# Patient Record
Sex: Female | Born: 1950 | Race: Black or African American | Hispanic: No | Marital: Married | Smoking: Former smoker
Health system: Southern US, Community
[De-identification: ages and names within clinical notes are randomized; demographics above are authoritative.]

## PROBLEM LIST (undated history)

## (undated) DIAGNOSIS — J45909 Unspecified asthma, uncomplicated: Secondary | ICD-10-CM

## (undated) DIAGNOSIS — I10 Essential (primary) hypertension: Secondary | ICD-10-CM

## (undated) DIAGNOSIS — I2541 Coronary artery aneurysm: Secondary | ICD-10-CM

## (undated) DIAGNOSIS — R42 Dizziness and giddiness: Secondary | ICD-10-CM

## (undated) DIAGNOSIS — Z201 Contact with and (suspected) exposure to tuberculosis: Secondary | ICD-10-CM

## (undated) DIAGNOSIS — G4730 Sleep apnea, unspecified: Secondary | ICD-10-CM

## (undated) HISTORY — PX: KNEE SURGERY: SHX244

## (undated) HISTORY — PX: ANKLE FUSION: SHX881

## (undated) HISTORY — PX: APPENDECTOMY: SHX54

## (undated) HISTORY — PX: ABDOMINAL HYSTERECTOMY: SHX81

## (undated) HISTORY — PX: TUBAL LIGATION: SHX77

## (undated) HISTORY — PX: BREAST LUMPECTOMY: SHX2

---

## 2010-03-07 ENCOUNTER — Emergency Department (HOSPITAL_COMMUNITY): Admission: EM | Admit: 2010-03-07 | Discharge: 2010-03-07 | Payer: Self-pay | Admitting: Family Medicine

## 2010-03-17 ENCOUNTER — Emergency Department (HOSPITAL_COMMUNITY): Admission: EM | Admit: 2010-03-17 | Discharge: 2010-03-17 | Payer: Self-pay | Admitting: Emergency Medicine

## 2011-06-22 HISTORY — PX: HERNIA REPAIR: SHX51

## 2015-06-25 ENCOUNTER — Emergency Department (HOSPITAL_COMMUNITY): Payer: Medicare Other

## 2015-06-25 ENCOUNTER — Encounter (HOSPITAL_COMMUNITY): Payer: Self-pay | Admitting: Emergency Medicine

## 2015-06-25 ENCOUNTER — Emergency Department (HOSPITAL_COMMUNITY)
Admission: EM | Admit: 2015-06-25 | Discharge: 2015-06-25 | Disposition: A | Discharge status: Home / Self Care | Payer: Medicare Other | Attending: Emergency Medicine | Admitting: Emergency Medicine

## 2015-06-25 DIAGNOSIS — R0600 Dyspnea, unspecified: Secondary | ICD-10-CM | POA: Insufficient documentation

## 2015-06-25 DIAGNOSIS — R079 Chest pain, unspecified: Secondary | ICD-10-CM | POA: Diagnosis present

## 2015-06-25 DIAGNOSIS — R600 Localized edema: Secondary | ICD-10-CM | POA: Insufficient documentation

## 2015-06-25 DIAGNOSIS — E669 Obesity, unspecified: Secondary | ICD-10-CM | POA: Insufficient documentation

## 2015-06-25 HISTORY — DX: Unspecified asthma, uncomplicated: J45.909

## 2015-06-25 HISTORY — DX: Contact with and (suspected) exposure to tuberculosis: Z20.1

## 2015-06-25 HISTORY — DX: Essential (primary) hypertension: I10

## 2015-06-25 HISTORY — DX: Dizziness and giddiness: R42

## 2015-06-25 HISTORY — DX: Coronary artery aneurysm: I25.41

## 2015-06-25 HISTORY — DX: Sleep apnea, unspecified: G47.30

## 2015-06-25 LAB — BASIC METABOLIC PANEL
Anion gap: 8 (ref 5–15)
BUN: 14 mg/dL (ref 6–20)
CALCIUM: 9.8 mg/dL (ref 8.9–10.3)
CHLORIDE: 104 mmol/L (ref 101–111)
CO2: 28 mmol/L (ref 22–32)
CREATININE: 0.64 mg/dL (ref 0.44–1.00)
Glucose, Bld: 104 mg/dL — ABNORMAL HIGH (ref 65–99)
Potassium: 4 mmol/L (ref 3.5–5.1)
SODIUM: 140 mmol/L (ref 135–145)

## 2015-06-25 LAB — CBC
HCT: 37.4 % (ref 36.0–46.0)
Hemoglobin: 12.2 g/dL (ref 12.0–15.0)
MCH: 30.3 pg (ref 26.0–34.0)
MCHC: 32.6 g/dL (ref 30.0–36.0)
MCV: 93 fL (ref 78.0–100.0)
Platelets: 223 10*3/uL (ref 150–400)
RBC: 4.02 MIL/uL (ref 3.87–5.11)
RDW: 13.4 % (ref 11.5–15.5)
WBC: 6.6 10*3/uL (ref 4.0–10.5)

## 2015-06-25 LAB — I-STAT TROPONIN, ED: TROPONIN I, POC: 0 ng/mL (ref 0.00–0.08)

## 2015-06-25 LAB — BRAIN NATRIURETIC PEPTIDE: B NATRIURETIC PEPTIDE 5: 18.3 pg/mL (ref 0.0–100.0)

## 2015-06-25 NOTE — ED Notes (Signed)
PA at bedside.

## 2015-06-25 NOTE — Discharge Instructions (Signed)
Please follow up with our cardiologist for further management of your condition.  Use resources below to find a primary care provider near you.  Return to ER if your condition worsen or if you have other concerns   Emergency Department Resource Guide 1) Find a Doctor and Pay Out of Pocket Although you won't have to find out who is covered by your insurance plan, it is a good idea to ask around and get recommendations. You will then need to call the office and see if the doctor you have chosen will accept you as a new patient and what types of options they offer for patients who are self-pay. Some doctors offer discounts or will set up payment plans for their patients who do not have insurance, but you will need to ask so you aren't surprised when you get to your appointment.  2) Contact Your Local Health Department Not all health departments have doctors that can see patients for sick visits, but many do, so it is worth a call to see if yours does. If you don't know where your local health department is, you can check in your phone book. The CDC also has a tool to help you locate your state's health department, and many state websites also have listings of all of their local health departments.  3) Find a Walk-in Clinic If your illness is not likely to be very severe or complicated, you may want to try a walk in clinic. These are popping up all over the country in pharmacies, drugstores, and shopping centers. They're usually staffed by nurse practitioners or physician assistants that have been trained to treat common illnesses and complaints. They're usually fairly quick and inexpensive. However, if you have serious medical issues or chronic medical problems, these are probably not your best option.  No Primary Care Doctor: - Call Health Connect at  [phone] - they can help you locate a primary care doctor that  accepts your insurance, provides certain services, etc. - Physician Referral Service-  [phone]  Chronic Pain Problems: Organization         Address  Phone   Notes  [facility]  [phone] Patients need to be referred by their primary care doctor.   Medication Assistance: Organization         Address  Phone   Notes  [facility]ication [facility] [address] [place], [place] [address] [phone] --Must be a resident of [place] -- Must have NO insurance coverage whatsoever (no Medicaid/ Medicare, etc.) -- The pt. MUST have a primary care doctor that directs their care regularly and follows them in the community   MedAssist  [phone]   [employer]  [phone]    Agencies that provide inexpensive medical care: Organization         Address  Phone   Notes  [facility]  [phone]) [phone]   [facility]    [phone]   [facility] [address] [place], [place] [address] [phone]   [facility] [address], [place] [phone]   Planned Parenthood    [phone](336) [phone]   [facility]    [phone]   [facility]  [address], [place] Phone:  [phone]) [phone], Fax:  [phone] Hours of Operation:  9 am - 6 pm, M-F.  Also accepts Medicaid/Medicare and self-pay.  [facility] for [place] [address], [address], [place] Phone: [phone], Fax: [phone]. Hours of Operation:  8:30 am - 5:30 pm, M-F.  Also accepts Medicaid and self-pay.  [facility] [place] [address] [place] Phone: [phone]   [place], [place], [place] [phone], Ext. [phone] Mondays & Thursdays: 7-9 AM.  First 15 patients are seen on a first come, first serve basis.    [place] Providers:  Organization         Address  Phone   Notes  [facility] [address],  [place] [phone] Also accepts self-pay patients.  [facility] [id number] [place], [place]  [phone]   [place], [address], [place] [phone]   [facility] [address] [place] [phone]   Lucianne Lei [address], [place]   [phone] Only accepts [place] Access [place] patients after they have their name applied to their card.   Self-Pay (no insurance) in [facility]:  Organization         Address  Phone   Notes  Sickle Cell Patients, [facility] [place] [phone]   [facility] [place] [phone]   [facility] [place]  [place], [place], [place] [phone]   [facility]/Dr. Osei-Bonsu  [address] [place] [address] [place] or [address], [place] [phone] Phone number for both [place] and [place] locations is the same.  Urgent Medical and [facility] [address], [place] [phone]   [facility] [address] [place] or [address] [phone]   [facility] [address], [place] [phone], phone; [phone], fax Sees patients 1st and 3rd Saturday of every month.  Must not qualify for public or private insurance (i.e. Medicaid, Medicare, [place] Health Choice, Veterans' Benefits)  Household income should be no more than 200% of the poverty level The clinic cannot treat you if you are pregnant or think you are pregnant  Sexually transmitted diseases are not treated at the clinic.    Dental Care: Organization         Address  Phone  Notes  [facility] Department of [facility] [place] [phone] Accepts children up to age 81 who are enrolled in  [place] or [place]; pregnant women with a Medicaid card; and children who have applied for Medicaid or  Health Choice, but were declined, whose parents can pay a reduced fee at time of service.  [facility] Department of [facility]  [address], [place] [phone] Accepts children up to age 44 who are enrolled in [place] or [place]; pregnant women with a Medicaid card; and children who have applied for Medicaid or  Health Choice, but were declined, whose parents can pay a reduced fee at time of service.  [facility] PRO[facility]  [place] [phone] Patients are seen by appointment only. Walk-ins are not accepted. [place] will see patients 52 years of age and older. Monday - Tuesday (8am-5pm) Most Wednesdays (8:30-5pm) $30 per  visit, cash only  [facility] Adult [employer] PROGRAM  [address], [address] [phone] Patients are seen by appointment only. Walk-ins are not accepted. [place] will see patients 48 years of age and older. One Wednesday Evening (Monthly: Volunteer Based).  $30 per visit, cash only  [place]  [phone] for adults; Children under age 62, call Graduate Pediatric Dentistry at [phone]. Children aged 41-14, please call [phone] to request a pediatric application.  Dental services are provided in all areas of dental care including fillings, crowns and bridges, complete and partial dentures, implants, gum treatment, root canals, and extractions. Preventive care is also provided. Treatment is provided to both adults and children. Patients are selected via a lottery and there is often a waiting list.   [facility] [address] [place]  [phone] [email or web address]   [facility] [address] [place], [place] [phone], Ext. [phone] Second and Fourth Thursday of each month, opens at 6:30  AM; Clinic ends at 9 AM.  Patients are seen on a first-come first-served basis, and a limited number are seen during each clinic.   [facility]  [address] Hillard Danker [place], [place] [phone]   Eligibility Requirements You must have lived in [place], [place], or [place] counties for at least the last three months.   You cannot be eligible for state or federal sponsored [employer], including [employer], [place], or [employer].   You generally cannot be eligible for healthcare insurance through your employer.    How to apply: Eligibility screenings are held every Tuesday and Wednesday afternoon from 1:00 pm until 4:00 pm. You do not need an appointment for the interview!  [facility] [address], [place], [place]   [place]  [place] Department  [place]  [phone]    Behavioral Health Resources in the Community: Intensive Outpatient Programs Organization         Address  Phone  Notes  [facility] [place]. [address] [place], [place] [phone]   [facility] Outpatient [address] [place], [place]   ADS: Alcohol & Drug Svcs [address] [place], [place]   [place] [address]  [place], [place] or [phone]   Substance Abuse Resources Organization         Address  Phone  Notes  Alcohol and Drug Services  [phone]   [place]  [phone]   The [place]   Chinita Pester  [phone]   Residential & Outpatient Substance Abuse Program  [phone]   Psychological Services Organization         Address  Phone  Notes  [facility] [place]  [place]  [phone]   [place] [address] [place] or  [phone]    Mobile Crisis Teams Organization         Address  Phone  Notes  [facility], Mobile Crisis Care Unit  [phone]   [facility]  [address] [place], [place]   [place] Levels [address], [place] [phone]    Self-Help/Support Groups Organization         Address  Phone  Notes  [facility]ental [facility] of [place] - variety of support groups  [place] Call for more information  Narcotics Anonymous (NA), [facility] [address], [employer] [place]  2 meetings at this location   Special educational needs teacher         Address  Phone  Notes  ASAP Residential Treatment [place],    [place]  [phone]   [facility]  [address] [place] [id number], [place], [place]   [place], [place] [phone] Admissions: 8am-3pm M-F  Incentives Substance [place] [address]    [place], [place] [phone]   [facility] [address] [place], [place], [place]   [facility] [address],  [place], [place]   [facility] Programs - Intensive Outpatient [address] Kristeen [address], [place], [place]   [facility] ([place] [address].,  [place], [place] [phone] or [phone]   [facility]idential Treatment Services (RTS) [address] [place], [place] Accepts Medicaid  [facility] [place] [address],  [place] [phone] Substance Abuse/Addiction Treatment   [facility] Organization         Address  Phone  Notes  [facility]  [phone]   Domenic Schwab, PhD [address] Arlis Porta [place], [place]   [phone] or [phone]   [place] and [place], [place] [phone]   [facility] [address],  [place], [place] [phone] Insurance/Medicaid/sponsorship through [facility] and Families [address], [address] [place]                                    [place], [place] [phone] [place] [address], [place] [phone]    Dr. Adele Schilder  [phone]   [facility] Dept. 1) [address], [place] 2) [place] 3)  [place] [address], [place] [phone]  [phone]   [place] [phone] or [phone] (After Hours)

## 2015-06-25 NOTE — ED Notes (Signed)
Transported to X-ray

## 2015-06-25 NOTE — ED Notes (Addendum)
[facility]  Pt woke up with substernal chest pressure around 5:30am.  Took 4 baby ASA and pain improved.  States she has continued to have intermittent pain throughout the day.  She took a nap and woke up with heart racing and sob this afternoon.  She took another 4 baby ASA around 2:30pm today.  EMS administered 1 SL NTG with no changes.  Upon arrival to hospital pt denies pain at this time.  Denies sob, nausea, and vomiting.  States EMS came out last Thursday for same symptoms.  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

## 2015-06-25 NOTE — ED Provider Notes (Signed)
Patient seen/examined in the Emergency Department in conjunction with Midlevel Provider Laveda Normanran Patient reports chest pain/discomfort, now improving at this time Plan: she just had extensive workup in [place].  She had cardiac cath and also CT chest over the christmas holiday.  Plan to receive those records and make final disposition.  Pt currently stable/appropriate at this time   Jill Rhineonald Shadaya Marschner, MD 06/25/15 1717

## 2015-06-25 NOTE — ED Notes (Signed)
MD at bedside. 

## 2015-06-25 NOTE — ED Notes (Signed)
NS working on getting pt's medical records from [place]

## 2015-06-25 NOTE — ED Provider Notes (Signed)
CSN: [id number]     Arrival date & time 06/25/15  1544 History   First MD Initiated Contact with Patient 06/25/15 1546     No chief complaint on file.    (Consider location/radiation/quality/duration/timing/severity/associated sxs/prior Treatment) HPI   65 year old female brought here via EMS from home for evaluation of chest discomfort. Patient reports this morning she was awoke with a sensation of central chest pressure with having trouble taking deep breath. Chest pressure does radiates to her back and lasting for less than an hour, accompanied with tachypnea. She immediately took 4 baby aspirin and noticed that symptoms improved. Symptom has been intermittent throughout the day. She went to sleep for her midafternoon nap and awoke again with the same chest pressure sensation. She did took an additional 4 baby aspirins and uses her CPAP and contacted EMS. EMS arrived and gave her 1 sublingual nitroglycerin without any relief. However her pain has since resolved. She mentioned having similar symptoms this past December in [place] where she lives. 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 She does have history of asthma in which he uses inhaler twice daily without any increasing use. She sleeps with a CPAP.  She denies having any fever, chills, URI symptoms, productive cough, hemoptysis, increased leg swelling or rash.  No past medical history on file. No past surgical history on file. No family history on file. Social History  Substance Use Topics  . Smoking status: Not on file  . Smokeless tobacco: Not on file  . Alcohol Use: Not on file   OB History    No data available     Review of Systems  All other systems reviewed and are negative.     Allergies  Review of patient's allergies indicates not on  file.  Home Medications   Prior to Admission medications   Not on File   There were no vitals taken for this visit. Physical Exam  Constitutional: She appears well-developed and well-nourished. No distress.  Obese African-American female appears to be in no acute discomfort.  HENT:  Head: Atraumatic.  Eyes: Conjunctivae are normal.  Neck: Neck supple. No JVD present.  Cardiovascular: Normal rate and regular rhythm.   Pulmonary/Chest: Effort normal and breath sounds normal. No respiratory distress. She has no wheezes. She has no rales. She exhibits no tenderness.  Abdominal: Soft. There is no tenderness.  Musculoskeletal: She exhibits edema (bilateral 1+ pitting edema to lower extremities.).  Neurological: She is alert.  Skin: No rash noted.  Psychiatric: She has a normal mood and affect.  Nursing note and vitals reviewed.   ED Course  Procedures (including critical care time) Labs Review Labs Reviewed  BASIC METABOLIC PANEL - Abnormal; Notable for the following:    Glucose, Bld 104 (*)    All other components within normal limits  CBC  BRAIN NATRIURETIC PEPTIDE  I-STAT TROPOININ, ED    Imaging Review Dg Chest 2 View  06/25/2015  CLINICAL DATA:  Substernal chest pain EXAM: CHEST - 2 VIEW COMPARISON:  None. FINDINGS: Cardiac shadow is within normal limits. The thoracic aorta is somewhat tortuous although does not appear dilated. The lungs are well aerated bilaterally. Some platelike changes in the right lung base are noted likely related atelectasis and/or scarring. No acute bony abnormality is seen. Mild degenerative changes  of the thoracic spine are noted. IMPRESSION: Changes in the right lung base which may be related to atelectasis or scarring. Electronically Signed   By: Alcide CleverMark  Lukens M.D.   On: 06/25/2015 17:06   I have personally reviewed and evaluated these images and lab results as part of my medical decision-making.   EKG Interpretation   Date/Time:  Wednesday  June 25 2015 15:57:01 EST Ventricular Rate:  81 PR Interval:  205 QRS Duration: 97 QT Interval:  371 QTC Calculation: 431 R Axis:   -5 Text Interpretation:  Sinus rhythm no acute ischemia Confirmed by Kandis MannanMACKUEN,  COURTNEY ([id number]) on 06/25/2015 4:14:03 PM      MDM   Final diagnoses:  Paroxysmal nocturnal dyspnea    BP 132/90 mmHg  Pulse 73  Temp(Src) 98.1 F (36.7 C) (Oral)  Resp 16  Ht 5\' 9"  (1.753 m)  Wt 102.967 kg  BMI 33.51 kg/m2  SpO2 98%   4:25 PM Patient with history of cardiac disease here with symptoms suggestive of paroxysmal nocturnal dyspnea versus anginal pain. She is currently symptom free. She does have edema to her lower extremities but no evidence of JVD or wet lung sounds to suggest acute CHF. [place] medical [place] in [place] in which we will attempt to get it faxed here.  Plan to have patient admitted for further cardiac workup and cardiac rule out. Care discussed with Dr. Bebe ShaggyWickline  4:53 PM Dr. Bebe ShaggyWickline has evaluated pt and felt if her work up today is unremarkable then she can be discharged with outpt f/u.  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  She has remote L arm DVT.  Currently sxs felt to be related with PND.  Pt will likely need additional sleep study outpt.    5:55 PM I have received  patient [facility]. On December 19 patient has a cardiac catheterization procedure. Impression shows no obstructive coronary artery disease. 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 Mild mitral regurgitation and mild aortic root dilatation with a very tortuous abdominal aorta. Normal ventricular systolic function with an ejection fractions of 75%. Pt also had a PE study done which was negative.  Cardiac work up on the 19th of December was unremarkable.  It was thought that her CP  at that time is likely due to GERD.    Today her CXR, EKG, trop and labs are reassuring.  She does not have any active CP at this time.  Low suspicion for coronary aneurysm dissection leading to ACS.  Plan to have pt f/u with cardiologist for further management.  Strict return discussed.    Fayrene HelperBowie Cyprian [place], PA-C 06/25/15 1842  Courteney Randall AnLyn Mackuen, MD 06/27/15 [phone]

## 2015-06-27 ENCOUNTER — Telehealth: Payer: Self-pay

## 2015-07-02 ENCOUNTER — Ambulatory Visit (INDEPENDENT_AMBULATORY_CARE_PROVIDER_SITE_OTHER): Payer: Medicare Other | Admitting: Physician Assistant

## 2015-07-02 ENCOUNTER — Encounter: Payer: Self-pay | Admitting: Physician Assistant

## 2015-07-02 VITALS — BP 120/78 | HR 90 | Temp 98.0°F | Resp 16 | Ht 65.0 in | Wt 233.6 lb

## 2015-07-02 DIAGNOSIS — R931 Abnormal findings on diagnostic imaging of heart and coronary circulation: Secondary | ICD-10-CM

## 2015-07-02 DIAGNOSIS — R9431 Abnormal electrocardiogram [ECG] [EKG]: Secondary | ICD-10-CM

## 2015-07-02 DIAGNOSIS — Z8669 Personal history of other diseases of the nervous system and sense organs: Secondary | ICD-10-CM

## 2015-07-02 DIAGNOSIS — Z139 Encounter for screening, unspecified: Secondary | ICD-10-CM | POA: Diagnosis not present

## 2015-07-02 DIAGNOSIS — Z87898 Personal history of other specified conditions: Secondary | ICD-10-CM | POA: Diagnosis not present

## 2015-07-02 LAB — LIPID PANEL
Cholesterol: 93 mg/dL — ABNORMAL LOW (ref 125–200)
HDL: 49 mg/dL (ref >=46)
LDL CALC: 36 mg/dL (ref <130)
TRIGLYCERIDES: 42 mg/dL (ref <150)
Total CHOL/HDL Ratio: 1.9 Ratio (ref <=5.0)
VLDL: 8 mg/dL (ref <30)

## 2015-07-02 LAB — POCT GLYCOSYLATED HEMOGLOBIN (HGB A1C): Hemoglobin A1C: 6.1
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
## 2015-07-03 LAB — TSH: TSH: 1.818 u[IU]/mL (ref 0.350–4.500)

## 2015-07-10 ENCOUNTER — Telehealth: Payer: Self-pay

## 2015-07-10 ENCOUNTER — Institutional Professional Consult (permissible substitution): Payer: Medicare Other | Admitting: Neurology

## 2015-07-10 NOTE — Telephone Encounter (Signed)
Pt is needing a refill on lasix 40 mg and effexor 37 1/2 mg called into [facility] on [address]  Best number [phone]

## 2015-07-10 NOTE — Telephone Encounter (Signed)
  Abnormal Q waves on electrocardigram: Will send her to Dr. Nadara Eaton with the hopes that she can be seen in the next two weeks. She has not had a heart attack, but there is some tortuosity of her heart vessels. ED staff with cardiology consult feel her pain is secondary to PND. Will send her to Dr. Ardeen Fillers for further evaluation of her history of sleep apnea. Will see her back in the next 2-4 months.  - EKG 12-Lead - Ambulatory referral to Cardiology   Casimiro Needle can we refill for pt?

## 2015-07-11 ENCOUNTER — Other Ambulatory Visit: Payer: Self-pay | Admitting: Physician Assistant

## 2015-07-11 MED ORDER — FUROSEMIDE 40 MG PO TABS: 40.0000 mg | ORAL_TABLET | Freq: Every day | ORAL | Status: DC

## 2015-07-11 MED ORDER — VENLAFAXINE HCL 37.5 MG PO TABS: 37.5000 mg | ORAL_TABLET | Freq: Every day | ORAL | Status: DC

## 2015-07-11 NOTE — Telephone Encounter (Signed)
I sent these to [facility] [address]. Refilled for 3 months.

## 2015-07-11 NOTE — Telephone Encounter (Signed)
I am trying to send to [facility] [address]. [facility] [address]. I am unable to reorder for some reason.

## 2015-07-11 NOTE — Telephone Encounter (Signed)
She should be able to go to any [facility]. Deliah Boston, MS, PA-C 3:44 PM, 07/11/2015

## 2015-07-14 NOTE — Telephone Encounter (Signed)
I had a note faxed from [facility] stating that the pt reported she takes the ER cap, not the reg release tablet. I also had a faxed req from [facility] asking for a Rx. To clear up confusion, I called pt who stated that [facility] was supposed to have cancelled the req because they couldn't get it to her soon enough. She stated that she has been taking the ER, but she did p/up the reg release tablets we sent in and she has been taking them w/out noticing any difference. She prefers to stay on them for now in hopes that it may help her wean off of them. Pt agreed to call me is she has any problems with the change.

## 2015-07-17 ENCOUNTER — Other Ambulatory Visit: Payer: Self-pay

## 2015-07-17 ENCOUNTER — Ambulatory Visit (INDEPENDENT_AMBULATORY_CARE_PROVIDER_SITE_OTHER): Payer: Medicare Other | Admitting: Neurology

## 2015-07-17 ENCOUNTER — Encounter: Payer: Self-pay | Admitting: Neurology

## 2015-07-17 VITALS — BP 120/88 | HR 74 | Resp 20 | Ht 67.0 in | Wt 231.0 lb

## 2015-07-17 DIAGNOSIS — I2510 Atherosclerotic heart disease of native coronary artery without angina pectoris: Secondary | ICD-10-CM | POA: Insufficient documentation

## 2015-07-17 DIAGNOSIS — K219 Gastro-esophageal reflux disease without esophagitis: Secondary | ICD-10-CM | POA: Diagnosis not present

## 2015-07-17 DIAGNOSIS — I25118 Atherosclerotic heart disease of native coronary artery with other forms of angina pectoris: Secondary | ICD-10-CM

## 2015-07-17 DIAGNOSIS — J45909 Unspecified asthma, uncomplicated: Secondary | ICD-10-CM | POA: Insufficient documentation

## 2015-07-17 DIAGNOSIS — G4733 Obstructive sleep apnea (adult) (pediatric): Secondary | ICD-10-CM | POA: Diagnosis not present

## 2015-07-17 DIAGNOSIS — J4540 Moderate persistent asthma, uncomplicated: Secondary | ICD-10-CM | POA: Diagnosis not present

## 2015-07-17 DIAGNOSIS — G4726 Circadian rhythm sleep disorder, shift work type: Secondary | ICD-10-CM | POA: Insufficient documentation

## 2015-07-17 DIAGNOSIS — F410 Panic disorder [episodic paroxysmal anxiety] without agoraphobia: Secondary | ICD-10-CM

## 2015-07-17 DIAGNOSIS — Z9989 Dependence on other enabling machines and devices: Principal | ICD-10-CM

## 2015-07-17 MED ORDER — VENLAFAXINE HCL 37.5 MG PO TABS: 37.5000 mg | ORAL_TABLET | Freq: Every day | ORAL | Status: AC

## 2015-07-17 NOTE — Progress Notes (Signed)
SLEEP MEDICINE CLINIC   Provider:  Melvyn Novas, M D  Referring Provider: Silvestre Mesi Primary Care Physician:  No PCP Per Patient   Chief Complaint  Patient presents with  . New Patient (Initial Visit)    history of sleep apnea, sleep study available, diagnosed with paroxysmal nocturnal dyspnea, IS ON CPAP, but was not told to bring cpap, her allergist was taking care of cpap but no longer, rm 10, alone    HPI:  Jill Flores is a 65 y.o. female , seen here as a referral by PA  Clark for a sleep evaluation, was send to cardiologist  ( Dr Jacinto Halim)  and neurologist.  Did not bring CPAP, but now has panic attacks at night, interfering with her sleep. Brought her [place] records, which our sleep lab requested .   Chief complaint according to patient : "I am reluctant to go to sleep because I have panic attacks at night"  Jill Flores feels that her CPAP may need to be adjusted-  she was diagnosed with a condition of paroxysmal nocturnal dyspnea. 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 These diagnoses were established in [place]. She also has a history of hypertension, other heart disease, allergic asthma and was diagnosed with sleep apnea in [place] and titrated to CPAP. Her surgical history includes right breast lumpectomy in 2007 abdominal hernia repair 2013 right knee replacement 2005 and a tonsillectomy ectomy in 1969.   Sleep habits are as follows: She describes her bedroom is cool, quiet and dark. She used to watch TV in the bedroom but does not longer. The patient states that she goes to bed as soon as gets dark. Here in [place] she sleeps alone. She sleeps for 4 hours which is her usual pattern, wakes up at 4 hour mark and gets up usually doing some homework housework etc. she may return to bed after 3 or 4 hours but sometimes she doesn't. She used to snore heavily and this was the reason for the evaluation for sleep apnea  after which she ended up on CPAP. The diagnosis of obstructive sleep apnea was first established in 2007. Her sleep habits have recently changed after she developed panic attacks or anxiety attacks at night.  She developed pain in her hips and was placed on steroids, provoking the first of many a panic attack, and causing HTN and weird dreams. She could not tolerate it, and became afraid to go to sleep.  She developed palpitations and diaphoresis. Sometimes she would not even be able to go to sleep, sometimes she would be woken by these attacks. She became fearful of going to bed.She was always anxious, now she was panicked.  She averages less than 6 hours of sleep is looking to have a repeat sleep test and check if she needs CPAP and if the settings still suit her.  She reports still vivid dreams if going to sleep on a heavy stomach, dreaming of snakes in her bed. Her husband has caught her trying to jump out of bed or leaving the bed , she has been thrashing kicking and he left the marital bedroom in response. The patient noted that these behaviors and dream enactments happen towards her last hour of sleep and not within the first or early sleep stages. This would account for a REM sleep behavior. She has no history of sleepwalking before, she has no history of night terrors.   Sleep medical history and  family sleep history: Mother died when she was 60, father when she was 26. Older sister raised her. Older step brother.   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 This has affected her circadian rhythm ever since. She only sleeps for hours at a time ever since she had this work check your. The patient is married her husband lives most of the year in [place] she splits her time between [place] for her daughter and granddaughter live. No caffeine, no ETOH, no smoking- quit 1984.   Review of Systems: Out of a complete 14 system review,  the patient complains of only the following symptoms, and all other reviewed systems are negative. Snoring, weight gained, parasomnia, anxiety. Insomnia. 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 How likely are you to doze in the following situations: 0 = not likely, 1 = slight chance, 2 = moderate chance, 3 = high chance  Sitting and Reading? 3 Watching Television?3 Sitting inactive in a public place (theater or meeting)?3 Lying down in the afternoon when circumstances permit?3 Sitting and talking to someone?0 Sitting quietly after lunch without alcohol?2 In a car, while stopped for a few minutes in traffic?0 As a passenger in a car for an hour without a break?3  Total =16    Fatigue severity score 15  , depression score 0 !!  Social History   Social History  . Marital Status: Married    Spouse Name: N/A  . Number of Children: N/A  . Years of Education: N/A   Occupational History  . Not on file.   Social History Main Topics  . Smoking status: Former Smoker    Quit date: 06/21/1982  . Smokeless tobacco: Not on file  . Alcohol Use: No  . Drug Use: No  . Sexual Activity: Not on file   Other Topics Concern  . Not on file   Social History Narrative    Family History  Problem Relation Age of Onset  . Diabetes Father   . Cancer Sister     Bladder  . Cancer Paternal Grandmother     Uterine  . Hypertension Paternal Grandmother     Past Medical History  Diagnosis Date  . Aneurysm (arteriovenous) of coronary vessels   . Hypertension   . Sleep apnea   . Asthma   . Vertigo   . Exposure to TB     Past Surgical History  Procedure Laterality Date  . Appendectomy    . Abdominal hysterectomy    . Breast lumpectomy Right   . Knee surgery Right   . Ankle fusion Right   . Hernia repair  2013  . Tubal ligation      Current Outpatient Prescriptions  Medication Sig Dispense Refill  . acetaminophen (TYLENOL) 500 MG tablet Take 500 mg by  mouth 2 (two) times daily as needed for mild pain.    Marland Kitchen amLODipine-olmesartan (AZOR) 5-20 MG tablet Take 1 tablet by mouth daily.    . Ascorbic Acid (VITAMIN C) 1000 MG tablet Take by mouth.    Marland Kitchen aspirin 81 MG tablet Take 81 mg by mouth daily.    Marland Kitchen atorvastatin (LIPITOR) 20 MG tablet Take 20 mg by mouth daily.    . Cholecalciferol (VITAMIN D-3) 1000 units CAPS Take 1 capsule by mouth every evening.     . fluocinonide-emollient (LIDEX-E) 0.05 % cream Apply 1 application topically daily as needed. For rash    .  Fluticasone-Salmeterol (ADVAIR) 250-50 MCG/DOSE AEPB Inhale 1 puff into the lungs 2 (two) times daily.    . furosemide (LASIX) 40 MG tablet Take 1 tablet (40 mg total) by mouth daily. 30 tablet 3  . metoprolol tartrate (LOPRESSOR) 25 MG tablet Take by mouth.    . Multiple Vitamin (MULTIVITAMIN WITH MINERALS) TABS tablet Take 1 tablet by mouth daily.    Marland Kitchen omega-3 acid ethyl esters (LOVAZA) 1 g capsule Take 1 g by mouth 2 (two) times daily.    . pantoprazole (PROTONIX) 40 MG tablet Take 40 mg by mouth daily.    Marland Kitchen venlafaxine (EFFEXOR) 37.5 MG tablet Take 1 tablet (37.5 mg total) by mouth daily. 30 tablet 3  . vitamin B-12 (CYANOCOBALAMIN) 1000 MCG tablet Take 1,000 mcg by mouth daily.     No current facility-administered medications for this visit.    Allergies as of 07/17/2015 - Review Complete 07/17/2015  Allergen Reaction Noted  . Caffeine  06/25/2015  . Citric acid  06/25/2015  . Diphenhydramine  06/25/2015  . Nafcillin  06/25/2015  . Pork-derived products  06/25/2015  . Prednisone  06/25/2015  . Vancomycin  06/25/2015    Vitals: BP 120/88 mmHg  Pulse 74  Resp 20  Ht  (1.702 m)  Wt 231 lb (104.781 kg)  BMI 36.17 kg/m2 Last Weight:  Wt Readings from Last 1 Encounters:  07/17/15 231 lb (104.781 kg)   ZOX:WRUE mass index is 36.17 kg/(m^2).     Last Height:   Ht Readings from Last 1 Encounters:  07/17/15  (1.702 m)    Physical exam:  General: The patient is  awake, alert and appears not in acute distress. The patient is well groomed. Head: Normocephalic, atraumatic. Neck is supple. Mallampati 4  neck circumference: 15. 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  Cardiovascular:  Regular rate and rhythm , without  murmurs or carotid bruit, and without distended neck veins. Respiratory: Lungs are clear to auscultation. Skin:  Without evidence of edema, or rash Trunk: BMI is elevated 37. The patient's posture is erect  Neurologic exam : The patient is awake and alert, oriented to place and time.   Memory subjective described as intact.  Attention span & concentration ability appears normal.  Speech is fluent,  without dysarthria, dysphonia or aphasia.  Mood and affect are appropriate.  Cranial nerves: Pupils are equal and briskly reactive to light. Funduscopic exam without evidence of pallor or edema.  Extraocular movements  in vertical and horizontal planes intact and without nystagmus. Visual fields by finger perimetry are intact. Hearing to finger rub intact.   Facial sensation intact to fine touch.  Facial motor strength is symmetric and tongue and uvula move midline. Shoulder shrug was symmetrical.   Motor exam:   tone, muscle bulk and intact symmetric strength in all extremities.  Sensory:  Fine touch, pinprick and vibration were normal.  Coordination: Rapid alternating movements in the fingers/hands was normal. Finger-to-nose maneuver  normal without evidence of ataxia, dysmetria or tremor.  Gait and station: Patient walks without assistive device and is able unassisted to climb up to the exam table. Strength within normal limits.  Stance is stable and normal. Turns with  3 Steps. Romberg testing is  negative.  Deep tendon reflexes: in the  upper and lower extremities are symmetric and intact. Babinski maneuver response is downgoing.  The patient was advised of the nature of the diagnosed sleep  disorder , the  treatment options and risks for general a health and wellness arising from not treating the condition.  I spent more than 45 minutes of face to face time with the patient. Greater than 50% of time was spent in counseling and coordination of care. We have discussed the diagnosis and differential and I answered the patient's questions.     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 She retired in 2005.  2) Jill Flores has been diagnosed with obstructive sleep apnea in 2007 she has been using CPAP but recently feels that she no longer gets enough sleep restorative sleep or refreshing sleep. There is a question if the CPAP at the current settings are still working well for her and she would like to be reevaluated. She has a history of childhood asthma but no recent pulmonary diagnosis. She does have comorbidities of coronary artery disease, coronary aneurysm,OSA with obesity/ retrognathia.  3) Jill Flores described waking up panicked with anxiety or panic attacks associated with palpitation, diaphoresis and at times she has tried in her sleep to leave the bedroom and her husband had to wake her and keep her from doing so. She is known to act out in her dreams stage. She has also wondered if some of the palpitations and chest discomfort could be related to gastroesophageal reflux disease. Her first panic attacks followed taking a steroid dose pack and a high dose for hip and joint pain however they have continued after the steroids were discontinued.    Plan:  Treatment plan and additional workup : I will invite the patient for a split night  polysomnography, I would like for her to bring her CPAP machine to her next visit with me. She has tried nasal pillows without success in the past and has settled on a full face mask. My goal is to also watch out for parasomnias at night and I will ask for a excellent video and audio montage to that he can witness any sleep related behaviors. I will send a carbon copy to her referring  [facility] and cardiologist Dr. Jacinto Halim.      Jill Mylar Lynette Topete MD  07/17/2015   CC: Ofilia Neas, Pa-c [address] [place], [place] [address]

## 2015-07-17 NOTE — Patient Instructions (Signed)
Insomnia Insomnia is a sleep disorder that makes it difficult to fall asleep or to stay asleep. Insomnia can cause tiredness (fatigue), low energy, difficulty concentrating, mood swings, and poor performance at work or school.  There are three different ways to classify insomnia:  Difficulty falling asleep.  Difficulty staying asleep.  Waking up too early in the morning. Any type of insomnia can be long-term (chronic) or short-term (acute). Both are common. Short-term insomnia usually lasts for three months or less. Chronic insomnia occurs at least three times a week for longer than three months. CAUSES  Insomnia may be caused by another condition, situation, or substance, such as:  Anxiety.  Certain medicines.  Gastroesophageal reflux disease (GERD) or other gastrointestinal conditions.  Asthma or other breathing conditions.  Restless legs syndrome, sleep apnea, or other sleep disorders.  Chronic pain.  Menopause. This may include hot flashes.  Stroke.  Abuse of alcohol, tobacco, or illegal drugs.  Depression.  Caffeine.   Neurological disorders, such as Alzheimer disease.  An overactive thyroid (hyperthyroidism). The cause of insomnia may not be known. RISK FACTORS Risk factors for insomnia include:  Gender. Women are more commonly affected than men.  Age. Insomnia is more common as you get older.  Stress. This may involve your professional or personal life.  Income. Insomnia is more common in people with lower income.  Lack of exercise.   Irregular work schedule or night shifts.  Traveling between different time zones. SIGNS AND SYMPTOMS If you have insomnia, trouble falling asleep or trouble staying asleep is the main symptom. This may lead to other symptoms, such as:  Feeling fatigued.  Feeling nervous about going to sleep.  Not feeling rested in the morning.  Having trouble concentrating.  Feeling irritable, anxious, or depressed. TREATMENT   Treatment for insomnia depends on the cause. If your insomnia is caused by an underlying condition, treatment will focus on addressing the condition. Treatment may also include:   Medicines to help you sleep.  Counseling or therapy.  Lifestyle adjustments. HOME CARE INSTRUCTIONS   Take medicines only as directed by your health care provider.  Keep regular sleeping and waking hours. Avoid naps.  Keep a sleep diary to help you and your health care provider figure out what could be causing your insomnia. Include:   When you sleep.  When you wake up during the night.  How well you sleep.   How rested you feel the next day.  Any side effects of medicines you are taking.  What you eat and drink.   Make your bedroom a comfortable place where it is easy to fall asleep:  Put up shades or special blackout curtains to block light from outside.  Use a white noise machine to block noise.  Keep the temperature cool.   Exercise regularly as directed by your health care provider. Avoid exercising right before bedtime.  Use relaxation techniques to manage stress. Ask your health care provider to suggest some techniques that may work well for you. These may include:  Breathing exercises.  Routines to release muscle tension.  Visualizing peaceful scenes.  Cut back on alcohol, caffeinated beverages, and cigarettes, especially close to bedtime. These can disrupt your sleep.  Do not overeat or eat spicy foods right before bedtime. This can lead to digestive discomfort that can make it hard for you to sleep.  Limit screen use before bedtime. This includes:  Watching TV.  Using your smartphone, tablet, and computer.  Stick to a routine. This   can help you fall asleep faster. Try to do a quiet activity, brush your teeth, and go to bed at the same time each night.  Get out of bed if you are still awake after 15 minutes of trying to sleep. Keep the lights down, but try reading or  doing a quiet activity. When you feel sleepy, go back to bed.  Make sure that you drive carefully. Avoid driving if you feel very sleepy.  Keep all follow-up appointments as directed by your health care provider. This is important. SEEK MEDICAL CARE IF:   You are tired throughout the day or have trouble in your daily routine due to sleepiness.  You continue to have sleep problems or your sleep problems get worse. SEEK IMMEDIATE MEDICAL CARE IF:   You have serious thoughts about hurting yourself or someone else.   This information is not intended to replace advice given to you by your health care provider. Make sure you discuss any questions you have with your health care provider.   Document Released: 06/04/2000 Document Revised: 02/26/2015 Document Reviewed: 03/08/2014 Elsevier Interactive Patient Education 2016 [employer] Inc.  

## 2015-07-23 ENCOUNTER — Other Ambulatory Visit: Payer: Self-pay

## 2015-07-23 NOTE — Telephone Encounter (Signed)
Patient is calling to request a refill for azor sent to [facility] on [address] [phone]

## 2015-07-24 MED ORDER — AMLODIPINE-OLMESARTAN 5-20 MG PO TABS: 1.0000 | ORAL_TABLET | Freq: Every day | ORAL | Status: AC

## 2015-07-24 NOTE — Telephone Encounter (Signed)
Jill Flores, we have not Rxd for pt before. Do you want to RF?

## 2015-07-27 ENCOUNTER — Ambulatory Visit (INDEPENDENT_AMBULATORY_CARE_PROVIDER_SITE_OTHER): Payer: Medicare Other | Admitting: Neurology

## 2015-07-27 DIAGNOSIS — G4733 Obstructive sleep apnea (adult) (pediatric): Secondary | ICD-10-CM | POA: Diagnosis not present

## 2015-07-27 DIAGNOSIS — G4726 Circadian rhythm sleep disorder, shift work type: Secondary | ICD-10-CM

## 2015-07-27 DIAGNOSIS — Z9989 Dependence on other enabling machines and devices: Principal | ICD-10-CM

## 2015-07-27 DIAGNOSIS — F410 Panic disorder [episodic paroxysmal anxiety] without agoraphobia: Secondary | ICD-10-CM

## 2015-07-27 DIAGNOSIS — I25118 Atherosclerotic heart disease of native coronary artery with other forms of angina pectoris: Secondary | ICD-10-CM

## 2015-07-28 NOTE — Sleep Study (Signed)
Please see the scanned sleep study interpretation located in the Procedure tab within the Chart Review section. 

## 2015-08-04 ENCOUNTER — Telehealth: Payer: Self-pay

## 2015-08-04 DIAGNOSIS — G4733 Obstructive sleep apnea (adult) (pediatric): Secondary | ICD-10-CM

## 2015-08-04 NOTE — Telephone Encounter (Signed)
I spoke to pt regarding her sleep study results. 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 Vickey Huger recommend starting a cpap. I also advised her that many PLMs were seen in her sleep and if the movements don't resolve with pap therapy then Dr. Vickey Huger could discuss treating them at her follow up appt. Pt verbalized understanding.  I advised her to avoid sleeping on her back. I advised her that I would send it the order to [facility] (they are a [employer] and should be able to provide pt supplies in [place] and [place]). Pt is agreeable to starting a cpap. Appt made for pt on 4/19 at 2:30.

## 2015-08-07 NOTE — Telephone Encounter (Signed)
Spoke to pt and advised her that I have sent the orders to [facility] and they are most likely processing the orders through her insurance and they will call her when that is approved and from there, [facility] can advise her on how to proceed with her cpap and supplies. I advised pt that I would have [facility] call the pt and discuss the status. Pt verbalized understanding and appreciation. I sent a message to our [facility] rep asking her to have [facility] give pt a call.

## 2015-08-07 NOTE — Telephone Encounter (Signed)
Patient called to advise she is having difficulty with RX to [facility] for CPAP supplies, both locations she has contacted do not have the RX. Please call [phone].

## 2015-08-18 NOTE — Telephone Encounter (Signed)
Received this notice from [facility]:        This order has been sent to our [place] to get Pt s/u with Cpap and Supplies.

## 2015-08-20 ENCOUNTER — Ambulatory Visit: Payer: Self-pay | Admitting: Cardiology

## 2015-09-02 ENCOUNTER — Ambulatory Visit: Payer: Self-pay | Admitting: Cardiology

## 2015-09-03 ENCOUNTER — Ambulatory Visit: Payer: Self-pay | Admitting: Physician Assistant

## 2015-10-08 ENCOUNTER — Ambulatory Visit: Payer: Self-pay | Admitting: Neurology

## 2015-11-18 ENCOUNTER — Ambulatory Visit: Payer: Self-pay | Admitting: Neurology

## 2015-11-25 ENCOUNTER — Other Ambulatory Visit: Payer: Self-pay | Admitting: Physician Assistant

## 2015-11-27 ENCOUNTER — Ambulatory Visit: Payer: Self-pay | Admitting: Neurology

## 2015-12-02 ENCOUNTER — Ambulatory Visit (INDEPENDENT_AMBULATORY_CARE_PROVIDER_SITE_OTHER): Payer: Medicare Other | Admitting: Adult Health

## 2015-12-02 ENCOUNTER — Encounter: Payer: Self-pay | Admitting: Adult Health

## 2015-12-02 VITALS — BP 118/83 | HR 80 | Ht 67.0 in | Wt 231.6 lb

## 2015-12-02 DIAGNOSIS — G4733 Obstructive sleep apnea (adult) (pediatric): Secondary | ICD-10-CM

## 2015-12-02 DIAGNOSIS — Z9989 Dependence on other enabling machines and devices: Principal | ICD-10-CM

## 2015-12-02 NOTE — Patient Instructions (Signed)
Continue using CPAP nightly If your symptoms worsen or you develop new symptoms please let us know.   

## 2015-12-02 NOTE — Progress Notes (Addendum)
PATIENT: Jill Flores DOB: June 30, 1950  REASON FOR VISIT: follow up- obstructive sleep apnea on CPAP HISTORY FROM: patient  HISTORY OF PRESENT ILLNESS: Jill Flores is a 65 year old female with a history of obstructive sleep apnea on CPAP. She returns today for a compliance download. Her download indicates that she uses her machine 30 out of 30 days for compliance of 100%. On average she uses her machine greater than 4 hours 27 out of 30 days for compliance of 90%. Her average usage is 5 hours and 55 minutes. Her residual AHI is 3.6 on a minimum pressure of 5 cm of water and maximum pressure of 10 cm of water with EPR of 3. She does not have a significant leak. She is using a full face mask. The patient states that she is sleeping better at night. Although she still feels sleepy throughout the day. Her Epworth sleepiness score is 8 and fatigue severity score is 20. She reports that since she started the CPAP she is not had any additional panic attacks at night. She denies any new neurological symptoms. She returns today for an evaluation.  HISTORY 07/17/15: Jill Flores is a 65 y.o. female , seen here as a referral by PA Clark for a sleep evaluation, was send to cardiologist ( Dr Jacinto Halim) and neurologist.  Did not bring CPAP, but now has panic attacks at night, interfering with her sleep. Brought her [place] records, which our sleep lab requested .   Chief complaint according to patient : "I am reluctant to go to sleep because I have panic attacks at night"  Jill Flores feels that her CPAP may need to be adjusted- she was diagnosed with a condition of paroxysmal nocturnal dyspnea. 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 These diagnoses were established in [place]. She also has a history of hypertension, other heart disease, allergic asthma and was diagnosed with sleep apnea in [place] and titrated to CPAP. Her surgical history includes right breast  lumpectomy in 2007 abdominal hernia repair 2013 right knee replacement 2005 and a tonsillectomy ectomy in 1969.   Sleep habits are as follows: She describes her bedroom is cool, quiet and dark. She used to watch TV in the bedroom but does not longer. The patient states that she goes to bed as soon as gets dark. Here in [place] she sleeps alone. She sleeps for 4 hours which is her usual pattern, wakes up at 4 hour mark and gets up usually doing some homework housework etc. she may return to bed after 3 or 4 hours but sometimes she doesn't. She used to snore heavily and this was the reason for the evaluation for sleep apnea after which she ended up on CPAP. The diagnosis of obstructive sleep apnea was first established in 2007. Her sleep habits have recently changed after she developed panic attacks or anxiety attacks at night.  She developed pain in her hips and was placed on steroids, provoking the first of many a panic attack, and causing HTN and weird dreams. She could not tolerate it, and became afraid to go to sleep.  She developed palpitations and diaphoresis. Sometimes she would not even be able to go to sleep, sometimes she would be woken by these attacks. She became fearful of going to bed.She was always anxious, now she was panicked.  She averages less than 6 hours of sleep is looking to have a repeat sleep test and check if she needs CPAP and  if the settings still suit her.  She reports still vivid dreams if going to sleep on a heavy stomach, dreaming of snakes in her bed. Her husband has caught her trying to jump out of bed or leaving the bed , she has been thrashing kicking and he left the marital bedroom in response. The patient noted that these behaviors and dream enactments happen towards her last hour of sleep and not within the first or early sleep stages. This would account for a REM sleep behavior. She has no history of sleepwalking before, she has no history of night  terrors.  Sleep medical history and family sleep history: Mother died when she was 17, father when she was 22. Older sister raised her. Older step brother.   REVIEW OF SYSTEMS: Out of a complete 14 system review of symptoms, the patient complains only of the following symptoms, and all other reviewed systems are negative.  Epworth 8, fatigue severity 20  Bruise easily, numbness, apnea, daytime sleepiness  ALLERGIES: Allergies  Allergen Reactions  . Caffeine     Rash   . Citric Acid     Rash   . Diphenhydramine     Shortness of breath and rapid heart beat   . Nafcillin     Rash   . Pork-Derived Products     Headaches and nightmares   . Prednisone     Panic attack    . Vancomycin     Shortness of breath and rapid heart beat     HOME MEDICATIONS: Outpatient Prescriptions Prior to Visit  Medication Sig Dispense Refill  . acetaminophen (TYLENOL) 500 MG tablet Take 500 mg by mouth 2 (two) times daily as needed for mild pain.    Marland Kitchen amLODipine-olmesartan (AZOR) 5-20 MG tablet Take 1 tablet by mouth daily. 90 tablet 1  . Ascorbic Acid (VITAMIN C) 1000 MG tablet Take by mouth.    Marland Kitchen aspirin 81 MG tablet Take 81 mg by mouth daily.    Marland Kitchen atorvastatin (LIPITOR) 20 MG tablet Take 20 mg by mouth daily.    . Cholecalciferol (VITAMIN D-3) 1000 units CAPS Take 1 capsule by mouth every evening.     . fluocinonide-emollient (LIDEX-E) 0.05 % cream Apply 1 application topically daily as needed. For rash    . furosemide (LASIX) 40 MG tablet TAKE ONE TABLET BY MOUTH ONCE DAILY 30 tablet 0  . metoprolol tartrate (LOPRESSOR) 25 MG tablet Take by mouth.    . Multiple Vitamin (MULTIVITAMIN WITH MINERALS) TABS tablet Take 1 tablet by mouth daily.    Marland Kitchen omega-3 acid ethyl esters (LOVAZA) 1 g capsule Take 1 g by mouth 2 (two) times daily.    . pantoprazole (PROTONIX) 40 MG tablet Take 40 mg by mouth daily.    Marland Kitchen venlafaxine (EFFEXOR) 37.5 MG tablet Take 1 tablet (37.5 mg total) by mouth daily. 90 tablet 1   . vitamin B-12 (CYANOCOBALAMIN) 1000 MCG tablet Take 1,000 mcg by mouth daily.    . Fluticasone-Salmeterol (ADVAIR) 250-50 MCG/DOSE AEPB Inhale 1 puff into the lungs 2 (two) times daily.     No facility-administered medications prior to visit.    PAST MEDICAL HISTORY: Past Medical History  Diagnosis Date  . Aneurysm (arteriovenous) of coronary vessels   . Hypertension   . Sleep apnea   . Asthma   . Vertigo   . Exposure to TB     PAST SURGICAL HISTORY: Past Surgical History  Procedure Laterality Date  . Appendectomy    .  Abdominal hysterectomy    . Breast lumpectomy Right   . Knee surgery Right   . Ankle fusion Right   . Hernia repair  2013  . Tubal ligation      FAMILY HISTORY: Family History  Problem Relation Age of Onset  . Diabetes Father   . Cancer Sister     Bladder  . Cancer Paternal Grandmother     Uterine  . Hypertension Paternal Grandmother     SOCIAL HISTORY: Social History   Social History  . Marital Status: Married    Spouse Name: N/A  . Number of Children: N/A  . Years of Education: N/A   Occupational History  . Not on file.   Social History Main Topics  . Smoking status: Former Smoker    Quit date: 06/21/1982  . Smokeless tobacco: Not on file  . Alcohol Use: No  . Drug Use: No  . Sexual Activity: Not on file   Other Topics Concern  . Not on file   Social History Narrative      PHYSICAL EXAM  Filed Vitals:   12/02/15 1130  BP: 118/83  Pulse: 80  Height: 5\' 7"  (1.702 m)  Weight: 231 lb 9.6 oz (105.053 kg)   Body mass index is 36.27 kg/(m^2).  Generalized: Well developed, in no acute distress  Neck: Circumference 15 inches, Mallampati 3+  Neurological examination  Mentation: Alert oriented to time, place, history taking. Follows all commands speech and language fluent Cranial nerve II-XII: Pupils were equal round reactive to light. Extraocular movements were full, visual field were full on confrontational test. Facial  sensation and strength were normal. Uvula tongue midline. Head turning and shoulder shrug  were normal and symmetric. Motor: The motor testing reveals 5 over 5 strength of all 4 extremities. Good symmetric motor tone is noted throughout.  Sensory: Sensory testing is intact to soft touch on all 4 extremities. No evidence of extinction is noted.  Coordination: Cerebellar testing reveals good finger-nose-finger and heel-to-shin bilaterally.  Gait and station: Gait is normal. Reflexes: Deep tendon reflexes are symmetric and normal bilaterally.   DIAGNOSTIC DATA (LABS, IMAGING, TESTING) - I reviewed patient records, labs, notes, testing and imaging myself where available.  Lab Results  Component Value Date   WBC 6.6 06/25/2015   HGB 12.2 06/25/2015   HCT 37.4 06/25/2015   MCV 93.0 06/25/2015   PLT 223 06/25/2015      Component Value Date/Time   NA 140 06/25/2015 1636   K 4.0 06/25/2015 1636   CL 104 06/25/2015 1636   CO2 28 06/25/2015 1636   GLUCOSE 104* 06/25/2015 1636   BUN 14 06/25/2015 1636   CREATININE 0.64 06/25/2015 1636   CALCIUM 9.8 06/25/2015 1636   GFRNONAA >60 06/25/2015 1636   GFRAA >60 06/25/2015 1636   Lab Results  Component Value Date   CHOL 93* 07/02/2015   HDL 49 07/02/2015   LDLCALC 36 07/02/2015   TRIG 42 07/02/2015   CHOLHDL 1.9 07/02/2015   Lab Results  Component Value Date   HGBA1C 6.1 07/02/2015    Lab Results  Component Value Date   TSH 1.818 07/02/2015      ASSESSMENT AND PLAN 65 y.o. year old female  has a past medical history of Aneurysm (arteriovenous) of coronary vessels; Hypertension; Sleep apnea; Asthma; Vertigo; and Exposure to TB. here with:  1. Obstructive sleep apnea on CPAP  Overall the patient's compliance is excellent. The patient is encouraged to use her CPAP nightly. Patient reports  daytime sleepiness however her Epworth sleepiness score has decreased since her last visit. She is advised that if her symptoms worsen or she  develops any new symptoms she should let us know. She will follow-up in 3 months with Dr. Vickey Hugerohmeier.    Butch PennyMegan Haddy Mullinax, MSN, NP-C 12/02/2015, 1:00 PM [facility] [address], [address] [place], [place] [address] [phone]

## 2015-12-02 NOTE — Progress Notes (Signed)
I agree with the assessment and plan as directed by NP .The patient is known to me .   Sevastian Witczak, MD  

## 2016-03-04 ENCOUNTER — Ambulatory Visit: Payer: Medicare Other | Admitting: Neurology

## 2016-06-02 ENCOUNTER — Ambulatory Visit (INDEPENDENT_AMBULATORY_CARE_PROVIDER_SITE_OTHER): Payer: Medicare Other | Admitting: Neurology

## 2016-06-02 ENCOUNTER — Encounter: Payer: Self-pay | Admitting: Neurology

## 2016-06-02 VITALS — BP 150/98 | HR 68 | Resp 16 | Ht 67.0 in | Wt 238.0 lb

## 2016-06-02 DIAGNOSIS — Z9989 Dependence on other enabling machines and devices: Secondary | ICD-10-CM

## 2016-06-02 DIAGNOSIS — G4733 Obstructive sleep apnea (adult) (pediatric): Secondary | ICD-10-CM | POA: Diagnosis not present

## 2016-06-02 DIAGNOSIS — G4762 Sleep related leg cramps: Secondary | ICD-10-CM | POA: Diagnosis not present

## 2016-06-02 NOTE — Progress Notes (Signed)
PATIENT: Jill Flores DOB: March 13, 1951  REASON FOR VISIT: follow up- obstructive sleep apnea on CPAP HISTORY FROM: patient  Dr. Jacinto Halim is her referring physician.   HISTORY OF PRESENT ILLNESS:   06-02-2016, CD Ms. Buchbinder is a 65 year old female with a history of obstructive sleep apnea on CPAP.  She returns today for a compliance download. The patient reports that she is more cold sensitive now, while still suffering sometimes from hot flushes especially at night. He do not interrupt her sleep so. Her CPAP compliance has been excellent she has used CPAP at 30 out of 30 days with a 93% compliance and average user time of 7 hours and 4 minutes. She is using an AutoSet between 5 and 10 cm water pressure with 3 cm EPR. Residual apnea index is 3.7 and 3.0 apneas are obstructive in nature. The 95th percentile pressure is 9.9, she straddles the upper limits of the AutoSet. I will increase the upper limit of her CPAP from 10-12 cm water but no other changes are necessary and I hopes that she can continue with her excellent compliance. She has no night terrors, no panic attacks at night reported since using CPAP. Her Epworth sleepiness score was excellently reduced to 1. fatigue severity at 15 points is below average and the geriatric depression score was endorsed at only 2 out of 15 points. Based on this I would be happy to see the patient once a year  Her last download indicated in July 2017 with Butch Penny, MD . that she used her machine 30 out of 30 days for compliance of 100%. On average she used her machine greater than 4 hours 27 out of 30 days for compliance of 90%. Her average usage is 5 hours and 55 minutes. Her residual AHI is 3.6 on a minimum pressure of 5 cm of water and maximum pressure of 10 cm of water with EPR of 3. She does not have a significant leak. She is using a full face mask. The patient states that she is sleeping better at night. Although she still feels sleepy throughout the  day. Her Epworth sleepiness score is 8 and fatigue severity score is 20. She reports that since she started the CPAP she is not had any additional panic attacks at night. She denies any new neurological symptoms. She returns today for an evaluation.  HISTORY 07/17/15: Stamatia Masri is a 65 y.o. female , seen here as a referral by PA Clark for a sleep evaluation, was send to cardiologist ( Dr Jacinto Halim) and neurologist.  Did not bring CPAP, but now has panic attacks at night, interfering with her sleep. Brought her [place] records, which our sleep lab requested . Chief complaint according to patient : "I am reluctant to go to sleep because I have panic attacks at night" Mrs. Vanrossum feels that her CPAP may need to be adjusted- she was diagnosed with a condition of paroxysmal nocturnal dyspnea. 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 These diagnoses were established in [place]. She also has a history of hypertension, other heart disease, allergic asthma and was diagnosed with sleep apnea in [place] and titrated to CPAP. Her surgical history includes right breast lumpectomy in 2007 abdominal hernia repair 2013 right knee replacement 2005 and a tonsillectomy ectomy in 1969.  Sleep habits are as follows: She describes her bedroom is cool, quiet and dark. She used to watch TV in the bedroom but does not longer. The patient  states that she goes to bed as soon as gets dark. Here in [place] she sleeps alone. She sleeps for 4 hours which is her usual pattern, wakes up at 4 hour mark and gets up usually doing some homework housework etc. she may return to bed after 3 or 4 hours but sometimes she doesn't. She used to snore heavily and this was the reason for the evaluation for sleep apnea after which she ended up on CPAP. The diagnosis of obstructive sleep apnea was first established in 2007. Her sleep habits have recently changed after she developed panic attacks or  anxiety attacks at night.  She developed pain in her hips and was placed on steroids, provoking the first of many a panic attack, and causing HTN and weird dreams. She could not tolerate it, and became afraid to go to sleep.  She developed palpitations and diaphoresis. Sometimes she would not even be able to go to sleep, sometimes she would be woken by these attacks. She became fearful of going to bed.She was always anxious, now she was panicked.  She averages less than 6 hours of sleep is looking to have a repeat sleep test and check if she needs CPAP and if the settings still suit her.  She reports still vivid dreams if going to sleep on a heavy stomach, dreaming of snakes in her bed. Her husband has caught her trying to jump out of bed or leaving the bed , she has been thrashing kicking and he left the marital bedroom in response. The patient noted that these behaviors and dream enactments happen towards her last hour of sleep and not within the first or early sleep stages. This would account for a REM sleep behavior. She has no history of sleepwalking before, she has no history of night terrors.  Sleep medical history and family sleep history: Mother died when she was 55, father when she was 24. Older sister raised her. Older step brother.   REVIEW OF SYSTEMS: Out of a complete 14 system review of symptoms, the patient complains only of the following symptoms, and all other reviewed systems are negative.  Epworth  1 from 8, fatigue severity  15 reduced from 20, geritricdepression 2/15   Bruise easily, numbness, apnea, daytime sleepiness  ALLERGIES: Allergies  Allergen Reactions  . Caffeine     Rash   . Citric Acid     Rash   . Diphenhydramine     Shortness of breath and rapid heart beat   . Nafcillin     Rash   . Pork-Derived Products     Headaches and nightmares   . Prednisone     Panic attack    . Vancomycin     Shortness of breath and rapid heart beat     HOME  MEDICATIONS: Outpatient Medications Prior to Visit  Medication Sig Dispense Refill  . acetaminophen (TYLENOL) 500 MG tablet Take 500 mg by mouth 2 (two) times daily as needed for mild pain.    Marland Kitchen amLODipine-olmesartan (AZOR) 5-20 MG tablet Take 1 tablet by mouth daily. 90 tablet 1  . Ascorbic Acid (VITAMIN C) 1000 MG tablet Take by mouth.    Marland Kitchen aspirin 81 MG tablet Take 81 mg by mouth daily.    Marland Kitchen atorvastatin (LIPITOR) 20 MG tablet Take 20 mg by mouth daily.    . budesonide-formoterol (SYMBICORT) 80-4.5 MCG/ACT inhaler Inhale 2 puffs into the lungs 2 (two) times daily.    . Cholecalciferol (VITAMIN D-3) 1000 units CAPS  Take 1 capsule by mouth every evening.     . clopidogrel (PLAVIX) 75 MG tablet Take by mouth.    . furosemide (LASIX) 40 MG tablet TAKE ONE TABLET BY MOUTH ONCE DAILY 30 tablet 0  . metoprolol tartrate (LOPRESSOR) 25 MG tablet Take by mouth.    . Multiple Vitamin (MULTIVITAMIN WITH MINERALS) TABS tablet Take 1 tablet by mouth daily.    Marland Kitchen. omega-3 acid ethyl esters (LOVAZA) 1 g capsule Take 1 g by mouth 2 (two) times daily.    . pantoprazole (PROTONIX) 40 MG tablet Take 40 mg by mouth daily.    Marland Kitchen. venlafaxine (EFFEXOR) 37.5 MG tablet Take 1 tablet (37.5 mg total) by mouth daily. 90 tablet 1  . vitamin B-12 (CYANOCOBALAMIN) 1000 MCG tablet Take 1,000 mcg by mouth daily.    . fluocinonide-emollient (LIDEX-E) 0.05 % cream Apply 1 application topically daily as needed. For rash     No facility-administered medications prior to visit.     PAST MEDICAL HISTORY: Past Medical History:  Diagnosis Date  . Aneurysm (arteriovenous) of coronary vessels   . Asthma   . Exposure to TB   . Hypertension   . Sleep apnea   . Vertigo     PAST SURGICAL HISTORY: Past Surgical History:  Procedure Laterality Date  . ABDOMINAL HYSTERECTOMY    . ANKLE FUSION Right   . APPENDECTOMY    . BREAST LUMPECTOMY Right   . HERNIA REPAIR  2013  . KNEE SURGERY Right   . TUBAL LIGATION      FAMILY  HISTORY: Family History  Problem Relation Age of Onset  . Diabetes Father   . Cancer Sister     Bladder  . Cancer Paternal Grandmother     Uterine  . Hypertension Paternal Grandmother     SOCIAL HISTORY: Social History   Social History  . Marital status: Married    Spouse name: N/A  . Number of children: N/A  . Years of education: N/A   Occupational History  . Not on file.   Social History Main Topics  . Smoking status: Former Smoker    Quit date: 06/21/1982  . Smokeless tobacco: Not on file  . Alcohol use No  . Drug use: No  . Sexual activity: Not on file   Other Topics Concern  . Not on file   Social History Narrative  . No narrative on file      PHYSICAL EXAM  Vitals:   06/02/16 0928  BP: (!) 150/98  Pulse: 68  Resp: 16  Weight: 238 lb (108 kg)  Height: 5\' 7"  (1.702 m)   Body mass index is 37.28 kg/m.  Generalized: Well developed, in no acute distress  Neck: Circumference 15 inches, Mallampati 3+, Double chin.  Neurological examination  Mentation: Alert oriented to time, place, history taking. Follows all commands speech and language fluent Cranial nerve :Taste and smell intact- Pupils were equal round reactive to light. Extraocular movements were full, visual field were full on confrontational test. Facial sensation and strength were normal. Uvula tongue midline. Head turning and shoulder shrug  were normal and symmetric. Motor:  5 / 5 strength of all 4 extremities. Good symmetric motor tone is noted throughout.   DIAGNOSTIC DATA (LABS, IMAGING, TESTING) - I reviewed patient records, labs, notes, testing and imaging myself where available.  Lab Results  Component Value Date   WBC 6.6 06/25/2015   HGB 12.2 06/25/2015   HCT 37.4 06/25/2015   MCV 93.0 06/25/2015  PLT 223 06/25/2015      Component Value Date/Time   NA 140 06/25/2015 1636   K 4.0 06/25/2015 1636   CL 104 06/25/2015 1636   CO2 28 06/25/2015 1636   GLUCOSE 104 (H) 06/25/2015  1636   BUN 14 06/25/2015 1636   CREATININE 0.64 06/25/2015 1636   CALCIUM 9.8 06/25/2015 1636   GFRNONAA >60 06/25/2015 1636   GFRAA >60 06/25/2015 1636   Lab Results  Component Value Date   CHOL 93 (L) 07/02/2015   HDL 49 07/02/2015   LDLCALC 36 07/02/2015   TRIG 42 07/02/2015   CHOLHDL 1.9 07/02/2015   Lab Results  Component Value Date   HGBA1C 6.1 07/02/2015    Lab Results  Component Value Date   TSH 1.818 07/02/2015      ASSESSMENT AND PLAN 65 y.o. year old female  has a past medical history of Aneurysm (arteriovenous) of coronary vessels; Asthma; Exposure to TB; Hypertension; Sleep apnea; and Vertigo. here with:  1. Obstructive sleep apnea on CPAP, highly compliant.  2. Sleep panic attacks, anxiety has resolved.  3. obesity  Overall the patient's compliance is excellent. The patient is encouraged to use her CPAP nightly.  Patient reports daytime sleepiness however her Epworth sleepiness score has decreased further since her last visit.  She has gained weight, and needs to get on low carb diet.  She likes aqua gymnastic and joined a gym.   She is advised that if her symptoms worsen or she develops any new symptoms she should let us know. She will follow-up in 12 months with mwe,  Dr. Vickey Hugerohmeier.    06/02/2016, 10:02 AM [facility] [address], [address] [place], [place] [address] [phone]

## 2016-06-02 NOTE — Patient Instructions (Signed)
Obesity, Adult Introduction Obesity is having too much body fat. If you have a BMI of 30 or more, you are obese. BMI is a number that explains how much body fat you have. Obesity is often caused by taking in (consuming) more calories than your body uses. Obesity can cause serious health problems. Changing your lifestyle can help to treat obesity. Follow these instructions at home: Eating and drinking  Follow advice from your doctor about what to eat and drink. Your doctor may tell you to:  Cut down on (limit) fast foods, sweets, and processed snack foods.  Choose low-fat options. For example, choose low-fat milk instead of whole milk.  Eat 5 or more servings of fruits or vegetables every day.  Eat at home more often. This gives you more control over what you eat.  Choose healthy foods when you eat out.  Learn what a healthy portion size is. A portion size is the amount of a certain food that is healthy for you to eat at one time. This is different for each person.  Keep low-fat snacks available.  Avoid sugary drinks. These include soda, fruit juice, iced tea that is sweetened with sugar, and flavored milk.  Eat a healthy breakfast.  Drink enough water to keep your pee (urine) clear or pale yellow.  Do not go without eating for long periods of time (do not fast).  Do not go on popular or trendy diets (fad diets). Physical Activity  Exercise often, as told by your doctor. Ask your doctor:  What types of exercise are safe for you.  How often you should exercise.  Warm up and stretch before being active.  Do slow stretching after being active (cool down).  Rest between times of being active. Lifestyle  Limit how much time you spend in front of your TV, computer, or video game system (be less sedentary).  Find ways to reward yourself that do not involve food.  Limit alcohol intake to no more than 1 drink a day for nonpregnant women and 2 drinks a day for men. One drink  equals 12 oz of beer, 5 oz of wine, or 1 oz of hard liquor. General instructions  Keep a weight loss journal. This can help you keep track of:  The food that you eat.  The exercise that you do.  Take over-the-counter and prescription medicines only as told by your doctor.  Take vitamins and supplements only as told by your doctor.  Think about joining a support group. Your doctor may be able to help with this.  Keep all follow-up visits as told by your doctor. This is important. Contact a doctor if:  You cannot meet your weight loss goal after you have changed your diet and lifestyle for 6 weeks. This information is not intended to replace advice given to you by your health care provider. Make sure you discuss any questions you have with your health care provider. Document Released: 08/30/2011 Document Revised: 11/13/2015 Document Reviewed: 03/26/2015  2017 Elsevier  

## 2016-06-20 IMAGING — CR DG CHEST 2V
2 series · 2 of 2 positions shown · non-contrast
Comparison: None.

CLINICAL DATA: Substernal chest pain

EXAM:
CHEST - 2 VIEW

[chest pa]
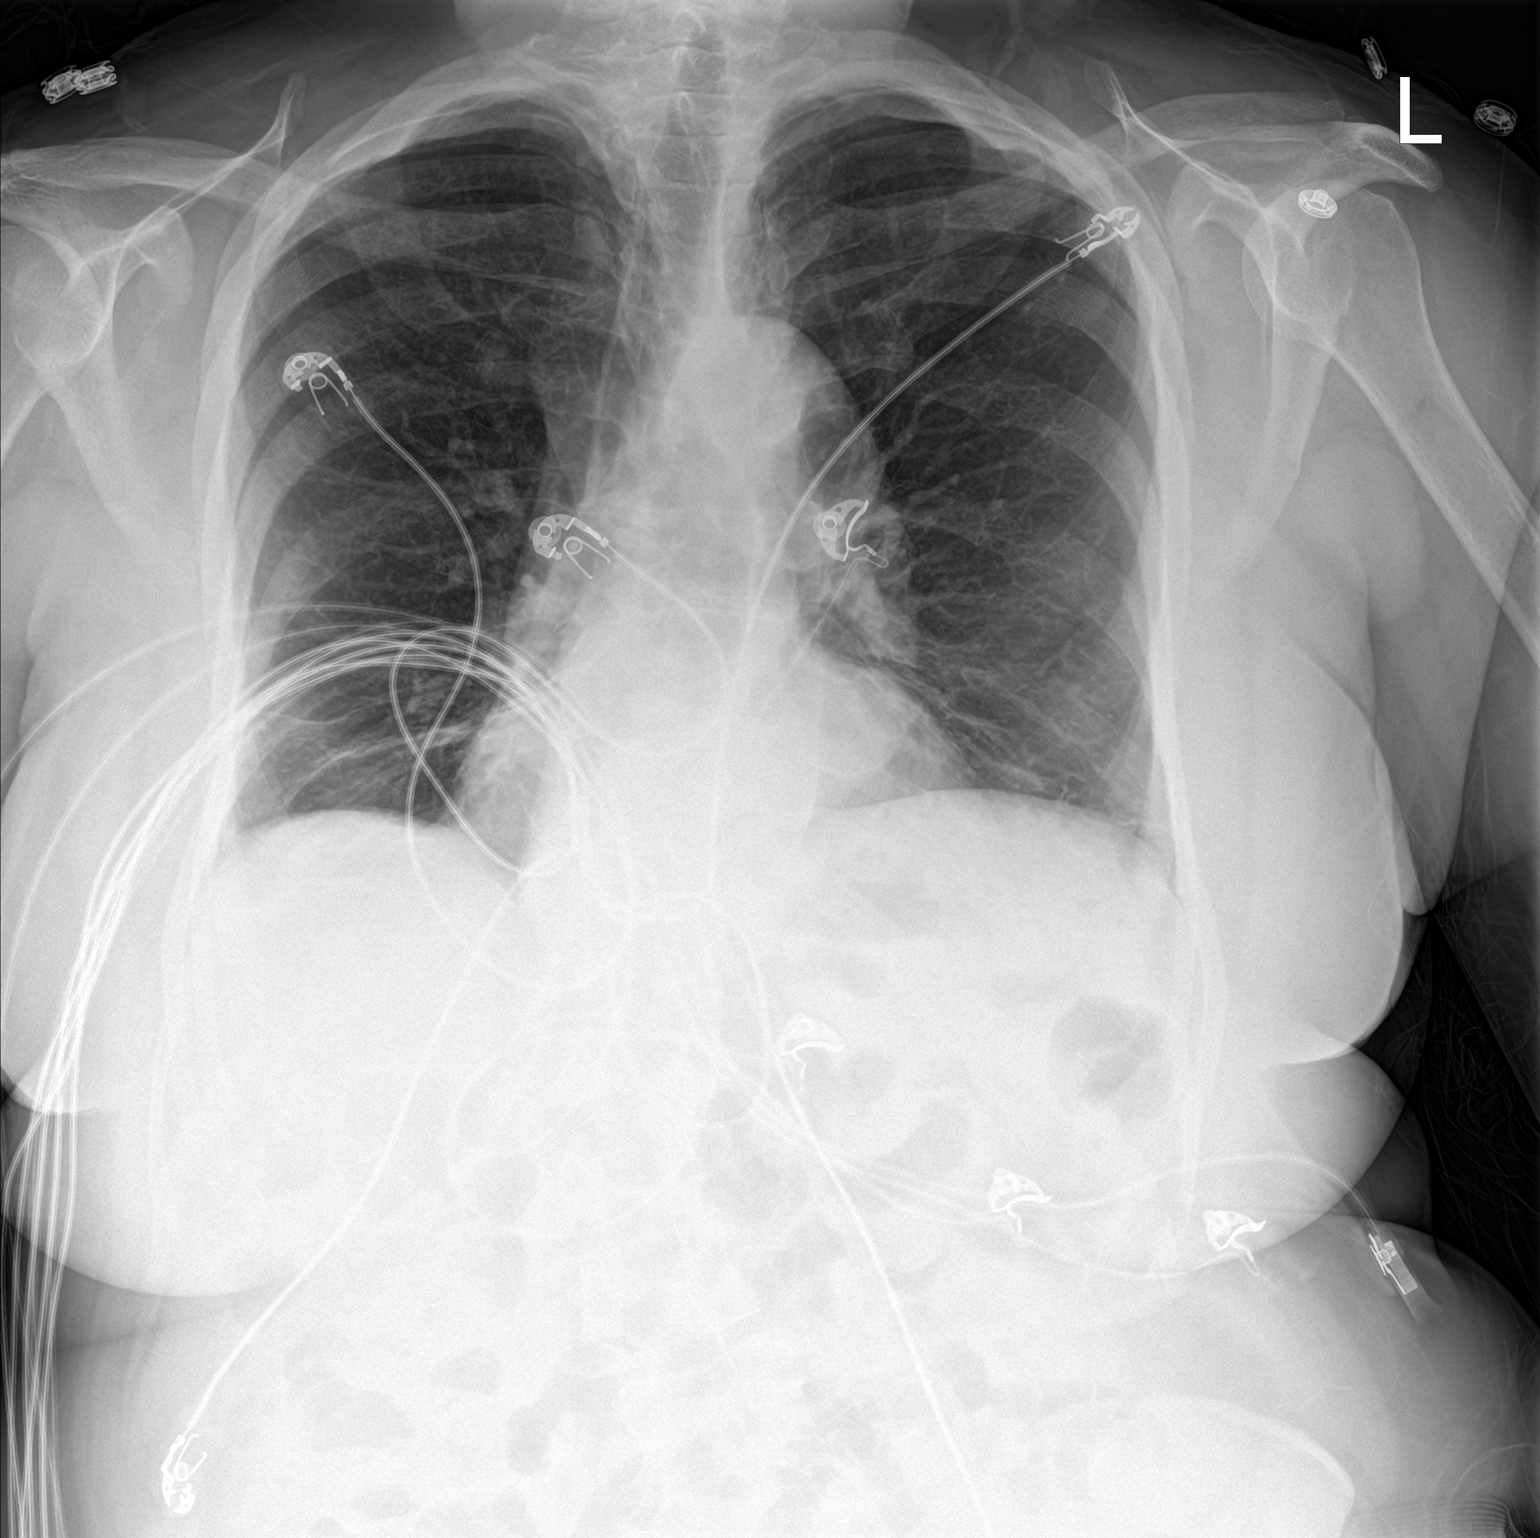

[chest lat]
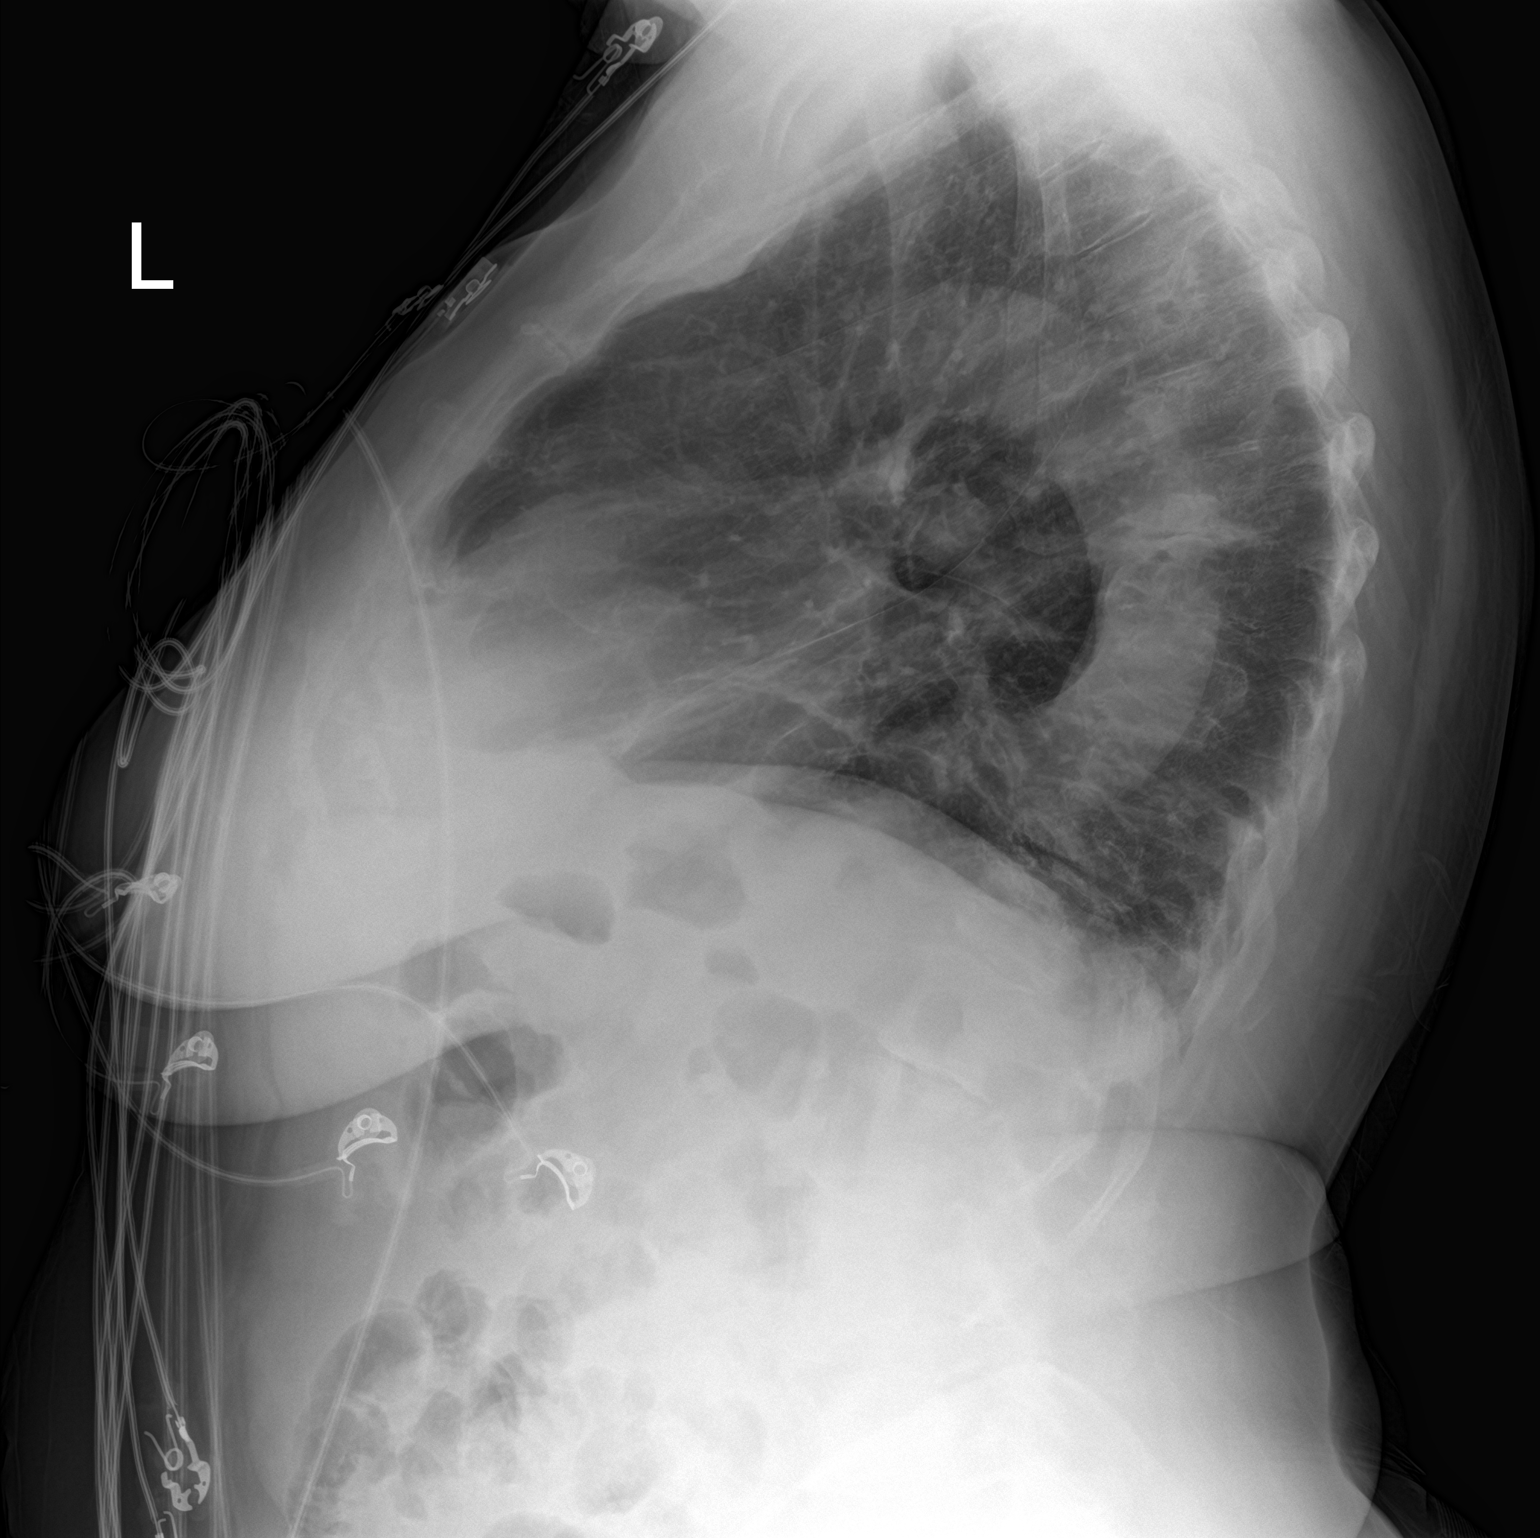

[2 of 2 positions shown; findings below may reference images not displayed]

FINDINGS: Cardiac shadow is within normal limits. The thoracic aorta is
somewhat tortuous although does not appear dilated. The lungs are
well aerated bilaterally. Some platelike changes in the right lung
base are noted likely related atelectasis and/or scarring. No acute
bony abnormality is seen. Mild degenerative changes of the thoracic
spine are noted.
IMPRESSION: Changes in the right lung base which may be related to atelectasis
or scarring.

## 2016-10-19 ENCOUNTER — Telehealth: Payer: Self-pay

## 2016-10-19 DIAGNOSIS — G4733 Obstructive sleep apnea (adult) (pediatric): Secondary | ICD-10-CM

## 2016-10-19 DIAGNOSIS — Z9989 Dependence on other enabling machines and devices: Principal | ICD-10-CM

## 2016-10-19 NOTE — Telephone Encounter (Signed)
Received a notification from [facility] that pt needs new RX for cpap suppplies. RX generated, Dr. Vickey Huger signed, and RX faxed to [facility].

## 2017-02-28 ENCOUNTER — Telehealth: Payer: Self-pay | Admitting: Neurology

## 2017-02-28 NOTE — Telephone Encounter (Signed)
Pt called in she said [facility] advised her she is not being compliant with CPAP. Said she is using CPAP every night, she was getting a report in her email every month until about the past month. She is needing to replace:  Safe Mask-Resmed-full face/large 10' tubing Headgear Humidifier chamber This is all the information the pt has to give

## 2017-03-01 NOTE — Telephone Encounter (Signed)
Sent [facility] this information so that they can look into this. The patient is 100% compliant

## 2017-05-26 ENCOUNTER — Ambulatory Visit: Payer: Medicare Other | Admitting: Neurology

## 2017-07-22 ENCOUNTER — Telehealth: Payer: Self-pay

## 2017-07-22 NOTE — Telephone Encounter (Signed)
Called pt to confirm if she was using [facility] as PCP and if she wanted to schedule AWV. [place]    Sherle PoeNicole Aydien Majette, B.A.  Care Guide - [facility] [phone]

## 2020-01-10 LAB — COLOGUARD: COLOGUARD: POSITIVE — AB

## 2023-06-02 LAB — COLOGUARD: COLOGUARD: NEGATIVE
